# Patient Record
Sex: Female | Born: 1950 | Race: White | Hispanic: No | Marital: Married | Smoking: Never smoker
Health system: Southern US, Community
[De-identification: ages and names within clinical notes are randomized; demographics above are authoritative.]

---

## 2004-02-12 ENCOUNTER — Ambulatory Visit: Payer: Self-pay | Admitting: Internal Medicine

## 2005-02-23 ENCOUNTER — Ambulatory Visit: Payer: Self-pay | Admitting: Internal Medicine

## 2006-03-07 ENCOUNTER — Ambulatory Visit: Payer: Self-pay | Admitting: Internal Medicine

## 2007-03-13 ENCOUNTER — Ambulatory Visit: Payer: Self-pay | Admitting: Internal Medicine

## 2007-04-24 ENCOUNTER — Ambulatory Visit: Payer: Self-pay | Admitting: Unknown Physician Specialty

## 2008-03-17 ENCOUNTER — Ambulatory Visit: Payer: Self-pay | Admitting: Internal Medicine

## 2009-03-31 ENCOUNTER — Ambulatory Visit: Payer: Self-pay | Admitting: Internal Medicine

## 2010-04-06 ENCOUNTER — Ambulatory Visit: Payer: Self-pay | Admitting: Internal Medicine

## 2011-04-10 ENCOUNTER — Ambulatory Visit: Payer: Self-pay | Admitting: Family Medicine

## 2011-04-12 ENCOUNTER — Ambulatory Visit: Payer: Self-pay | Admitting: Family Medicine

## 2012-04-12 ENCOUNTER — Ambulatory Visit: Payer: Self-pay | Admitting: Family Medicine

## 2012-06-18 ENCOUNTER — Ambulatory Visit: Payer: Self-pay | Admitting: Unknown Physician Specialty

## 2013-04-14 ENCOUNTER — Ambulatory Visit: Payer: Self-pay | Admitting: Family Medicine

## 2014-02-25 ENCOUNTER — Ambulatory Visit: Payer: Self-pay | Admitting: Family Medicine

## 2014-04-15 ENCOUNTER — Ambulatory Visit: Payer: Self-pay | Admitting: Family Medicine

## 2015-04-26 ENCOUNTER — Other Ambulatory Visit: Payer: Self-pay | Admitting: Family Medicine

## 2015-04-26 DIAGNOSIS — Z1231 Encounter for screening mammogram for malignant neoplasm of breast: Secondary | ICD-10-CM

## 2015-05-06 ENCOUNTER — Other Ambulatory Visit: Payer: Self-pay | Admitting: Family Medicine

## 2015-05-06 ENCOUNTER — Ambulatory Visit
Admission: RE | Admit: 2015-05-06 | Discharge: 2015-05-06 | Disposition: A | Discharge status: Home / Self Care | Payer: Medicare Other | Source: Ambulatory Visit | Attending: Family Medicine | Admitting: Family Medicine

## 2015-05-06 DIAGNOSIS — Z1231 Encounter for screening mammogram for malignant neoplasm of breast: Secondary | ICD-10-CM

## 2016-04-18 ENCOUNTER — Other Ambulatory Visit: Payer: Self-pay | Admitting: Family Medicine

## 2016-04-18 DIAGNOSIS — Z1231 Encounter for screening mammogram for malignant neoplasm of breast: Secondary | ICD-10-CM

## 2016-05-16 ENCOUNTER — Ambulatory Visit
Admission: RE | Admit: 2016-05-16 | Discharge: 2016-05-16 | Disposition: A | Discharge status: Home / Self Care | Payer: Medicare Other | Source: Ambulatory Visit | Attending: Family Medicine | Admitting: Family Medicine

## 2016-05-16 DIAGNOSIS — Z1231 Encounter for screening mammogram for malignant neoplasm of breast: Secondary | ICD-10-CM | POA: Diagnosis present

## 2017-04-10 ENCOUNTER — Other Ambulatory Visit: Payer: Self-pay | Admitting: Family Medicine

## 2017-04-10 DIAGNOSIS — Z1231 Encounter for screening mammogram for malignant neoplasm of breast: Secondary | ICD-10-CM

## 2017-05-17 ENCOUNTER — Ambulatory Visit
Admission: RE | Admit: 2017-05-17 | Discharge: 2017-05-17 | Disposition: A | Discharge status: Home / Self Care | Payer: Medicare Other | Source: Ambulatory Visit | Attending: Family Medicine | Admitting: Family Medicine

## 2017-05-17 DIAGNOSIS — Z1231 Encounter for screening mammogram for malignant neoplasm of breast: Secondary | ICD-10-CM | POA: Diagnosis not present

## 2018-04-12 ENCOUNTER — Other Ambulatory Visit: Payer: Self-pay | Admitting: Family Medicine

## 2018-04-12 DIAGNOSIS — Z1231 Encounter for screening mammogram for malignant neoplasm of breast: Secondary | ICD-10-CM

## 2018-05-20 ENCOUNTER — Ambulatory Visit
Admission: RE | Admit: 2018-05-20 | Discharge: 2018-05-20 | Disposition: A | Discharge status: Home / Self Care | Payer: Medicare Other | Source: Ambulatory Visit | Attending: Family Medicine | Admitting: Family Medicine

## 2018-05-20 DIAGNOSIS — Z1231 Encounter for screening mammogram for malignant neoplasm of breast: Secondary | ICD-10-CM | POA: Insufficient documentation

## 2019-02-24 ENCOUNTER — Emergency Department: Payer: Medicare Other

## 2019-02-24 ENCOUNTER — Other Ambulatory Visit: Payer: Self-pay

## 2019-02-24 ENCOUNTER — Encounter: Payer: Self-pay | Admitting: Emergency Medicine

## 2019-02-24 ENCOUNTER — Emergency Department
Admission: EM | Admit: 2019-02-24 | Discharge: 2019-02-24 | Disposition: A | Discharge status: Home / Self Care | Payer: Medicare Other | Attending: Emergency Medicine | Admitting: Emergency Medicine

## 2019-02-24 DIAGNOSIS — Y939 Activity, unspecified: Secondary | ICD-10-CM | POA: Diagnosis not present

## 2019-02-24 DIAGNOSIS — S32010A Wedge compression fracture of first lumbar vertebra, initial encounter for closed fracture: Secondary | ICD-10-CM

## 2019-02-24 DIAGNOSIS — S62102A Fracture of unspecified carpal bone, left wrist, initial encounter for closed fracture: Secondary | ICD-10-CM | POA: Diagnosis not present

## 2019-02-24 DIAGNOSIS — Y999 Unspecified external cause status: Secondary | ICD-10-CM | POA: Diagnosis not present

## 2019-02-24 DIAGNOSIS — Y929 Unspecified place or not applicable: Secondary | ICD-10-CM | POA: Insufficient documentation

## 2019-02-24 DIAGNOSIS — W010XXA Fall on same level from slipping, tripping and stumbling without subsequent striking against object, initial encounter: Secondary | ICD-10-CM | POA: Insufficient documentation

## 2019-02-24 DIAGNOSIS — Z79899 Other long term (current) drug therapy: Secondary | ICD-10-CM | POA: Diagnosis not present

## 2019-02-24 DIAGNOSIS — S6992XA Unspecified injury of left wrist, hand and finger(s), initial encounter: Secondary | ICD-10-CM | POA: Diagnosis present

## 2019-02-24 MED ORDER — TRAMADOL HCL 50 MG PO TABS
50.0000 mg | ORAL_TABLET | Freq: Once | ORAL | Status: AC
  Administered 2019-02-24: 50 mg via ORAL
  Filled 2019-02-24: qty 1

## 2019-02-24 MED ORDER — TRAMADOL HCL 50 MG PO TABS: 50.0000 mg | ORAL_TABLET | Freq: Two times a day (BID) | ORAL | 0 refills | Status: AC | PRN

## 2019-02-24 NOTE — ED Triage Notes (Signed)
Says slipped and fell this am on wet surface.  Injured low back and left wrist.

## 2019-02-24 NOTE — ED Provider Notes (Signed)
[facility] Emergency Department Provider Note   ____________________________________________   First MD Initiated Contact with Patient 02/24/19 1017     (approximate)  I have reviewed the triage vital signs and the nursing notes.   HISTORY  Chief Complaint Back Pain, Fall, and Wrist Pain    HPI Kaitlyn Galvan is a 68 y.o. female patient presents with left wrist pain/laceration and low back pain secondary to a slip and fall prior to arrival.  Patient states she went to a driveway which was when she slipped and fell breaking fall with left outstretched hand.  Patient denies loss sensation or loss of function.  Patient rates the pain as a 10/10.  Patient described the pain is "achy".  No palliative measures prior to arrival.         History reviewed. No pertinent past medical history.  There are no active problems to display for this patient.   History reviewed. No pertinent surgical history.  Prior to Admission medications   Medication Sig Start Date End Date Taking? Authorizing Provider  fluticasone (FLONASE) 50 MCG/ACT nasal spray Place 2 sprays into both nostrils daily.   Yes [provider]  lovastatin (MEVACOR) 20 MG tablet Take 20 mg by mouth at bedtime.   Yes [provider]    Allergies Penicillins  Family History  Problem Relation Age of Onset  . Breast cancer Neg Hx     Social History Social History   Tobacco Use  . Smoking status: Never Smoker  . Smokeless tobacco: Never Used  Substance Use Topics  . Alcohol use: Yes  . Drug use: Not on file    Review of Systems Constitutional: No fever/chills Eyes: No visual changes. ENT: No sore throat. Cardiovascular: Denies chest pain. Respiratory: Denies shortness of breath. Gastrointestinal: No abdominal pain.  No nausea, no vomiting.  No diarrhea.  No constipation. Genitourinary: Negative for dysuria. Musculoskeletal: Left wrist and back pain. Skin: Negative  for rash. Neurological: Negative for headaches, focal weakness or numbness. Allergic/Immunilogical: Penicillin ____________________________________________   PHYSICAL EXAM:  VITAL SIGNS: ED Triage Vitals  Enc Vitals Group     BP 02/24/19 1015 (!) 158/94     Pulse Rate 02/24/19 1015 71     Resp 02/24/19 1015 20     Temp 02/24/19 1015 98.4 F (36.9 C)     Temp Source 02/24/19 1015 Oral     SpO2 02/24/19 1015 100 %     Weight 02/24/19 1007 140 lb (63.5 kg)     Height 02/24/19 1007 5\' 3"  (1.6 m)     Head Circumference --      Peak Flow --      Pain Score 02/24/19 1006 10     Pain Loc --      Pain Edu? --      Excl. in [place]? --    Constitutional: Alert and oriented. Well appearing and in no acute distress. Neck: No cervical spine tenderness to palpation. Hematological/Lymphatic/Immunilogical: No cervical lymphadenopathy. Cardiovascular: Normal rate, regular rhythm. Grossly normal heart sounds.  Good peripheral circulation. Respiratory: Normal respiratory effort.  No retractions. Lungs CTAB. Gastrointestinal: Soft and nontender. No distention. No abdominal bruits. No CVA tenderness. Genitourinary: Deferred **}Musculoskeletal: No lower extremity tenderness nor edema.  No joint effusions. Neurologic:  Normal speech and language. No gross focal neurologic deficits are appreciated. No gait instability. Skin:  Skin is warm, dry and intact. No rash noted. Psychiatric: Mood and affect are normal. Speech and behavior are normal.  ____________________________________________   LABS (all labs ordered are listed, but only abnormal results are displayed)  Labs Reviewed - No data to display ____________________________________________  EKG   ____________________________________________  RADIOLOGY  ED MD interpretation:    Official radiology report(s): Dg Lumbar Spine 2-3 Views  Result Date: 02/24/2019 CLINICAL DATA:  Larey Seat today with low back pain. EXAM: LUMBAR SPINE - 2-3 VIEW  COMPARISON:  None. FINDINGS: Five lumbar type vertebral bodies show normal alignment. There is a superior endplate compression fracture of L1 with loss of height of 1/3. No visible retropulsion. No other regional acute fracture. No significant pre-existing degenerative changes. 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 IMPRESSION: Acute superior endplate fracture at L1 with loss of height of 1/3. Electronically Signed   By: Paulina Fusi M.D.   On: 02/24/2019 11:27   Dg Wrist Complete Left  Result Date: 02/24/2019 CLINICAL DATA:  Larey Seat on outstretched arm EXAM: LEFT WRIST - COMPLETE 3+ VIEW COMPARISON:  None. FINDINGS: Comminuted fracture of the distal radius with volar angulation and dorsal tilt of the distal radial articular surface. Mild impaction. No ulnar fracture or carpal bone fracture seen. IMPRESSION: Comminuted fracture of the distal radius with ventral angulation and dorsal tilt of the distal radial articular surface. Slight impaction. Electronically Signed   By: Paulina Fusi M.D.   On: 02/24/2019 11:26    ____________________________________________   PROCEDURES  Procedure(s) performed (including Critical Care):  Procedures   ____________________________________________   INITIAL IMPRESSION / ASSESSMENT AND PLAN / ED COURSE  As part of my medical decision making, I reviewed the following data within the electronic MEDICAL RECORD NUMBER     Patient presents with left wrist pain and deformity and low back pain secondary to a slip and fall this morning.  Discussed x-ray findings with patient.  Dr. Hyacinth Meeker will see patient in his office this afternoon.     ____________________________________________   FINAL CLINICAL IMPRESSION(S) / ED DIAGNOSES  Final diagnoses:  None     ED Discharge Orders    None       Note:  This document was prepared using Dragon voice recognition software and may include unintentional dictation errors.    Joni Reining, PA-C  02/24/19 1336    Sharman Cheek, MD 02/28/19 [phone]

## 2019-02-24 NOTE — ED Notes (Signed)
See triage note  Presents s/p fall this am  Injury to lower back and left wrist  Deformity note to left wrist with small laceration noted to posterior wrist

## 2019-02-27 ENCOUNTER — Other Ambulatory Visit: Payer: Self-pay | Admitting: Specialist

## 2019-02-28 ENCOUNTER — Other Ambulatory Visit
Admission: RE | Admit: 2019-02-28 | Discharge: 2019-02-28 | Disposition: A | Discharge status: Home / Self Care | Payer: Medicare Other | Source: Ambulatory Visit | Attending: Specialist | Admitting: Specialist

## 2019-02-28 DIAGNOSIS — Z20828 Contact with and (suspected) exposure to other viral communicable diseases: Secondary | ICD-10-CM | POA: Insufficient documentation

## 2019-02-28 DIAGNOSIS — Z01812 Encounter for preprocedural laboratory examination: Secondary | ICD-10-CM | POA: Insufficient documentation

## 2019-02-28 LAB — SARS CORONAVIRUS 2 (TAT 6-24 HRS): SARS Coronavirus 2: NEGATIVE

## 2019-03-03 ENCOUNTER — Other Ambulatory Visit: Payer: Self-pay

## 2019-03-03 ENCOUNTER — Ambulatory Visit
Admission: RE | Admit: 2019-03-03 | Discharge: 2019-03-03 | Disposition: A | Discharge status: Home / Self Care | Payer: Medicare Other | Attending: Specialist | Admitting: Specialist

## 2019-03-03 ENCOUNTER — Encounter
Admission: RE | Disposition: A | Discharge status: Home / Self Care | Payer: Self-pay | Source: Home / Self Care | Attending: Specialist

## 2019-03-03 ENCOUNTER — Ambulatory Visit: Payer: Medicare Other | Admitting: Certified Registered Nurse Anesthetist

## 2019-03-03 DIAGNOSIS — S52532A Colles' fracture of left radius, initial encounter for closed fracture: Secondary | ICD-10-CM | POA: Diagnosis present

## 2019-03-03 DIAGNOSIS — X58XXXA Exposure to other specified factors, initial encounter: Secondary | ICD-10-CM | POA: Insufficient documentation

## 2019-03-03 HISTORY — PX: ORIF RADIAL FRACTURE: SHX5113

## 2019-03-03 SURGERY — OPEN REDUCTION INTERNAL FIXATION (ORIF) RADIAL FRACTURE
Anesthesia: General | Site: Wrist | Laterality: Left

## 2019-03-03 MED ORDER — FENTANYL CITRATE (PF) 100 MCG/2ML IJ SOLN
INTRAMUSCULAR | Status: AC
  Administered 2019-03-03: 12:00:00 25 ug via INTRAVENOUS
  Filled 2019-03-03: qty 2

## 2019-03-03 MED ORDER — DEXAMETHASONE SODIUM PHOSPHATE 10 MG/ML IJ SOLN
INTRAMUSCULAR | Status: DC | PRN
  Administered 2019-03-03: 10 mg via INTRAVENOUS

## 2019-03-03 MED ORDER — FENTANYL CITRATE (PF) 100 MCG/2ML IJ SOLN
INTRAMUSCULAR | Status: AC
  Filled 2019-03-03: qty 2

## 2019-03-03 MED ORDER — FENTANYL CITRATE (PF) 100 MCG/2ML IJ SOLN
INTRAMUSCULAR | Status: AC
  Administered 2019-03-03: 13:00:00 25 ug via INTRAVENOUS
  Filled 2019-03-03: qty 2

## 2019-03-03 MED ORDER — MELOXICAM 7.5 MG PO TABS
15.0000 mg | ORAL_TABLET | ORAL | Status: AC
  Administered 2019-03-03: 09:00:00 15 mg via ORAL

## 2019-03-03 MED ORDER — MELOXICAM 15 MG PO TABS: 15.0000 mg | ORAL_TABLET | Freq: Every day | ORAL | 3 refills | Status: AC

## 2019-03-03 MED ORDER — NEOMYCIN-POLYMYXIN B GU 40-200000 IR SOLN
Status: AC
  Filled 2019-03-03: qty 20

## 2019-03-03 MED ORDER — LIDOCAINE HCL (CARDIAC) PF 100 MG/5ML IV SOSY
PREFILLED_SYRINGE | INTRAVENOUS | Status: DC | PRN
  Administered 2019-03-03: 80 mg via INTRAVENOUS

## 2019-03-03 MED ORDER — MIDAZOLAM HCL 2 MG/2ML IJ SOLN
INTRAMUSCULAR | Status: AC
  Filled 2019-03-03: qty 2

## 2019-03-03 MED ORDER — CEFAZOLIN SODIUM-DEXTROSE 2-4 GM/100ML-% IV SOLN
INTRAVENOUS | Status: AC
  Filled 2019-03-03: qty 100

## 2019-03-03 MED ORDER — HYDROMORPHONE HCL 1 MG/ML IJ SOLN
INTRAMUSCULAR | Status: AC
  Administered 2019-03-03: 14:00:00 0.5 mg via INTRAVENOUS
  Filled 2019-03-03: qty 1

## 2019-03-03 MED ORDER — CLINDAMYCIN PHOSPHATE 600 MG/50ML IV SOLN
INTRAVENOUS | Status: AC
  Filled 2019-03-03: qty 50

## 2019-03-03 MED ORDER — GABAPENTIN 400 MG PO CAPS: 400.0000 mg | ORAL_CAPSULE | Freq: Three times a day (TID) | ORAL | 3 refills | Status: AC

## 2019-03-03 MED ORDER — SEVOFLURANE IN SOLN
RESPIRATORY_TRACT | Status: AC
  Filled 2019-03-03: qty 250

## 2019-03-03 MED ORDER — HYDROMORPHONE HCL 1 MG/ML IJ SOLN
0.5000 mg | INTRAMUSCULAR | Status: DC | PRN
  Administered 2019-03-03 (×2): 0.5 mg via INTRAVENOUS

## 2019-03-03 MED ORDER — CHLORHEXIDINE GLUCONATE CLOTH 2 % EX PADS
6.0000 | MEDICATED_PAD | Freq: Once | CUTANEOUS | Status: AC
  Administered 2019-03-03: 09:00:00 6 via TOPICAL

## 2019-03-03 MED ORDER — BUPIVACAINE HCL (PF) 0.5 % IJ SOLN
INTRAMUSCULAR | Status: AC
  Filled 2019-03-03: qty 30

## 2019-03-03 MED ORDER — NEOMYCIN-POLYMYXIN B GU 40-200000 IR SOLN
Status: DC | PRN
  Administered 2019-03-03: 2 mL

## 2019-03-03 MED ORDER — HYDROCODONE-ACETAMINOPHEN 5-325 MG PO TABS: 1.0000 | ORAL_TABLET | Freq: Four times a day (QID) | ORAL | 0 refills | Status: AC | PRN

## 2019-03-03 MED ORDER — BUPIVACAINE HCL (PF) 0.5 % IJ SOLN
INTRAMUSCULAR | Status: DC | PRN
  Administered 2019-03-03: 25 mL

## 2019-03-03 MED ORDER — PROPOFOL 10 MG/ML IV BOLUS
INTRAVENOUS | Status: DC | PRN
  Administered 2019-03-03: 150 mg via INTRAVENOUS

## 2019-03-03 MED ORDER — MELOXICAM 7.5 MG PO TABS
ORAL_TABLET | ORAL | Status: AC
  Filled 2019-03-03: qty 2

## 2019-03-03 MED ORDER — ONDANSETRON HCL 4 MG/2ML IJ SOLN
INTRAMUSCULAR | Status: DC | PRN
  Administered 2019-03-03: 4 mg via INTRAVENOUS

## 2019-03-03 MED ORDER — FENTANYL CITRATE (PF) 100 MCG/2ML IJ SOLN
25.0000 ug | INTRAMUSCULAR | Status: AC | PRN
  Administered 2019-03-03 (×4): 25 ug via INTRAVENOUS

## 2019-03-03 MED ORDER — GABAPENTIN 300 MG PO CAPS
ORAL_CAPSULE | ORAL | Status: AC
  Filled 2019-03-03: qty 1

## 2019-03-03 MED ORDER — CLINDAMYCIN PHOSPHATE 600 MG/50ML IV SOLN
600.0000 mg | INTRAVENOUS | Status: AC
  Administered 2019-03-03: 10:00:00 600 mg via INTRAVENOUS

## 2019-03-03 MED ORDER — HYDROCODONE-ACETAMINOPHEN 5-325 MG PO TABS
ORAL_TABLET | ORAL | Status: AC
  Filled 2019-03-03: qty 1

## 2019-03-03 MED ORDER — FENTANYL CITRATE (PF) 100 MCG/2ML IJ SOLN
25.0000 ug | INTRAMUSCULAR | Status: DC | PRN
  Administered 2019-03-03 (×4): 25 ug via INTRAVENOUS

## 2019-03-03 MED ORDER — GABAPENTIN 300 MG PO CAPS
300.0000 mg | ORAL_CAPSULE | ORAL | Status: AC
  Administered 2019-03-03: 09:00:00 300 mg via ORAL

## 2019-03-03 MED ORDER — FENTANYL CITRATE (PF) 100 MCG/2ML IJ SOLN
INTRAMUSCULAR | Status: DC | PRN
  Administered 2019-03-03 (×3): 25 ug via INTRAVENOUS
  Administered 2019-03-03: 50 ug via INTRAVENOUS
  Administered 2019-03-03 (×3): 25 ug via INTRAVENOUS

## 2019-03-03 MED ORDER — PROPOFOL 10 MG/ML IV BOLUS
INTRAVENOUS | Status: AC
  Filled 2019-03-03: qty 20

## 2019-03-03 MED ORDER — ACETAMINOPHEN 10 MG/ML IV SOLN
INTRAVENOUS | Status: AC
  Filled 2019-03-03: qty 100

## 2019-03-03 MED ORDER — ACETAMINOPHEN 10 MG/ML IV SOLN
INTRAVENOUS | Status: DC | PRN
  Administered 2019-03-03: 1000 mg via INTRAVENOUS

## 2019-03-03 MED ORDER — CEFAZOLIN SODIUM-DEXTROSE 2-4 GM/100ML-% IV SOLN
2.0000 g | INTRAVENOUS | Status: AC
  Administered 2019-03-03: 10:00:00 2 g via INTRAVENOUS

## 2019-03-03 MED ORDER — SODIUM CHLORIDE FLUSH 0.9 % IV SOLN
INTRAVENOUS | Status: AC
  Filled 2019-03-03: qty 10

## 2019-03-03 MED ORDER — ONDANSETRON HCL 4 MG/2ML IJ SOLN: 4.0000 mg | Freq: Once | INTRAMUSCULAR | Status: DC | PRN

## 2019-03-03 MED ORDER — HYDROCODONE-ACETAMINOPHEN 5-325 MG PO TABS
1.0000 | ORAL_TABLET | Freq: Once | ORAL | Status: AC
  Administered 2019-03-03: 15:00:00 1 via ORAL

## 2019-03-03 MED ORDER — LACTATED RINGERS IV SOLN
INTRAVENOUS | Status: DC
  Administered 2019-03-03 (×2): via INTRAVENOUS

## 2019-03-03 MED ORDER — MIDAZOLAM HCL 2 MG/2ML IJ SOLN
INTRAMUSCULAR | Status: DC | PRN
  Administered 2019-03-03: 2 mg via INTRAVENOUS

## 2019-03-03 SURGICAL SUPPLY — 48 items
BIT DRILL 2.5X4 QC (BIT) ×3 IMPLANT
BLADE SURG MINI STRL (BLADE) ×3 IMPLANT
BNDG COHESIVE 4X5 TAN STRL (GAUZE/BANDAGES/DRESSINGS) IMPLANT
BNDG ESMARK 4X12 TAN STRL LF (GAUZE/BANDAGES/DRESSINGS) ×3 IMPLANT
CANISTER SUCT 1200ML W/VALVE (MISCELLANEOUS) ×3 IMPLANT
CHLORAPREP W/TINT 26 (MISCELLANEOUS) ×3 IMPLANT
COVER WAND RF STERILE (DRAPES) ×3 IMPLANT
CUFF TOURN SGL QUICK 18X4 (TOURNIQUET CUFF) IMPLANT
DRAPE FLUOR MINI C-ARM 54X84 (DRAPES) ×3 IMPLANT
DRSG GAUZE FLUFF 36X18 (GAUZE/BANDAGES/DRESSINGS) ×6 IMPLANT
ELECT REM PT RETURN 9FT ADLT (ELECTROSURGICAL) ×3
ELECTRODE REM PT RTRN 9FT ADLT (ELECTROSURGICAL) ×1 IMPLANT
GAUZE SPONGE 4X4 12PLY STRL (GAUZE/BANDAGES/DRESSINGS) ×3 IMPLANT
GAUZE XEROFORM 1X8 LF (GAUZE/BANDAGES/DRESSINGS) ×3 IMPLANT
GLOVE INDICATOR 8.0 STRL GRN (GLOVE) ×3 IMPLANT
GLOVE SURG ORTHO 8.5 STRL (GLOVE) ×3 IMPLANT
GOWN STRL REUS W/ TWL LRG LVL3 (GOWN DISPOSABLE) ×1 IMPLANT
GOWN STRL REUS W/TWL LRG LVL3 (GOWN DISPOSABLE) ×2
GOWN STRL REUS W/TWL LRG LVL4 (GOWN DISPOSABLE) ×3 IMPLANT
KIT TURNOVER KIT A (KITS) ×3 IMPLANT
NDL SAFETY ECLIPSE 18X1.5 (NEEDLE) ×1 IMPLANT
NEEDLE HYPO 18GX1.5 SHARP (NEEDLE) ×2
NS IRRIG 500ML POUR BTL (IV SOLUTION) ×3 IMPLANT
PACK EXTREMITY ARMC (MISCELLANEOUS) ×3 IMPLANT
PAD CAST CTTN 4X4 STRL (SOFTGOODS) ×1 IMPLANT
PADDING CAST 4IN STRL (MISCELLANEOUS) ×4
PADDING CAST BLEND 4X4 STRL (MISCELLANEOUS) ×2 IMPLANT
PADDING CAST COTTON 4X4 STRL (SOFTGOODS) ×2
PEG SUBCHONDRAL SMOOTH 2.0X18 (Peg) ×9 IMPLANT
PEG SUBCHONDRAL SMOOTH 2.0X20 (Peg) ×3 IMPLANT
PIN GUIDE .062 (PIN) ×3 IMPLANT
PLATE STAN 24.4X59.5 LT (Plate) ×3 IMPLANT
SCREW BN 12X3.5XNS CORT TI (Screw) ×4 IMPLANT
SCREW CORT 3.5X12 (Screw) ×8 IMPLANT
SCREW PEG LOCK 2.5X18 (Peg) ×6 IMPLANT
SCREW PEG LOCK 2.5X20 (Peg) ×3 IMPLANT
SPLINT CAST 1 STEP 3X12 (MISCELLANEOUS) ×3 IMPLANT
SPLINT CAST 1 STEP 4X30 (MISCELLANEOUS) IMPLANT
SPONGE LAP 18X18 RF (DISPOSABLE) ×3 IMPLANT
STAPLER SKIN PROX 35W (STAPLE) ×3 IMPLANT
STOCKINETTE BIAS CUT 4 980044 (GAUZE/BANDAGES/DRESSINGS) ×3 IMPLANT
STOCKINETTE STRL 4IN 9604848 (GAUZE/BANDAGES/DRESSINGS) ×3 IMPLANT
SUT VIC AB 2-0 SH 27 (SUTURE) ×2
SUT VIC AB 2-0 SH 27XBRD (SUTURE) ×1 IMPLANT
SUT VIC AB 3-0 PS2 18 (SUTURE) ×3 IMPLANT
SUT VIC AB 3-0 SH 27 (SUTURE) ×2
SUT VIC AB 3-0 SH 27X BRD (SUTURE) ×1 IMPLANT
WIRE Z .045 C-WIRE SPADE TIP (WIRE) ×6 IMPLANT

## 2019-03-03 NOTE — H&P (Signed)
THE PATIENT WAS SEEN PRIOR TO SURGERY TODAY.  HISTORY, ALLERGIES, HOME MEDICATIONS AND OPERATIVE PROCEDURE WERE REVIEWED. RISKS AND BENEFITS OF SURGERY DISCUSSED WITH PATIENT AGAIN.  NO CHANGES FROM INITIAL HISTORY AND PHYSICAL NOTED.    

## 2019-03-03 NOTE — Op Note (Signed)
03/03/2019  12:11 PM  PATIENT:  Kaitlyn Galvan    PRE-OPERATIVE DIAGNOSIS:  S52.532A colles fracture of left radius, initial encounter for closed fracture  POST-OPERATIVE DIAGNOSIS:  Same  PROCEDURE:  OPEN REDUCTION INTERNAL FIXATION (ORIF) RADIAL FRACTURE  SURGEON:  Park Breed, MD  TOURNIQUET TIME:  74  MIN  ANESTHESIA:   General  PREOPERATIVE INDICATIONS:  Kaitlyn Galvan is a  68 y.o. female with a diagnosis of S52.532A colles fracture of left radius, initial encounter for closed fracture who failed conservative measures and elected for surgical management.    The risks benefits and alternatives were discussed with the patient preoperatively including but not limited to the risks of infection, bleeding, nerve injury, malunion, nonunion, wrist stiffness, persistent wrist pain, osteoarthritis and the need for further surgery. Medical risks include but are not limited to DVT and pulmonary embolism, myocardial infarction, stroke, pneumonia, respiratory failure and death. Patient  understood these risks and wished to proceed.   OPERATIVE IMPLANTS: [employer] hand innovations plate, 4 hole  OPERATIVE FINDINGS: Comminuted left distal redius  OPERATIVE PROCEDURE: Patient was seen in the preoperative area. I marked the operative hand with the word yes and my initials according the hospital's correct site of surgery protocol. Patient was then brought to the operating roomand was placed supine on the operative table and underwent general anesthesia with an LMA.   The operative arm was prepped and draped in a sterile fashion. A timeout performed to verify the patient's name, date of birth, medical record number, correct site of surgery correct procedure to be performed. The timeout was also used a timeout to verify patient received antibiotics and appropriate instruments, implants and radiographs studies were available in the room. Once all in attendance were in agreement case began.   Patient  then had the operative extremity exsanguinated with an Esmarch. The tourniquet was placed on the upper extremity and inflated 250 mm.  A manual reduction of the fracture was performed. The fracture reduction was confirmed on FluoroScan imaging.  A linear incision was then made over the FCR tendon. The subcutaneous tissue was carefully dissected using Metzenbaum scissor and Adson pickup. Retractors were used to protect the radial artery and median nerve. The pronator quadratus was identified and incised and elevated off the volar surface of the distal radius. A 4 hole [employer] volar plate was then positioned on the under surface of the distal radius. It was held into position with a K wire. The position of the plate was confirmed on AP and lateral images. Once the plate was in good position a cortical screw was placed bicortically in the sliding hole. Attention was then turned to the distal pegs. The proximal row of pegs was placed first. Each individual peg hole was drilled and then measured with a depth gauge. The proximal row had 3 threaded pegs placed. The distal row was then drilled and smooth pegs were placed. The position and length of all screws were confirmed on AP and lateral FluoroScan imaging. Care was taken to avoid penetration of any peg through the articular surface of the distal radius.  Once all distal pegs were placed, the attention was turned back to placement of bicortical shaft screws. Additional screws were placed in the plate to fill the remaining holes. The wound was then copiously irrigated. Final FluoroScan imaging of the construct were taken. The fracture was in anatomic position and the hardware was well-positioned. The wound again was copiously irrigated. The soft tissue was then  carefully over the plate. The tissues were infiltrated with 1/2% marcaine.  The skin was closed with staples. Xeroform and a dry sterile dressing were applied along with a volar splint. I was scrubbed  and present for the entire case and all sharp and instrument counts were correct at the conclusion the case. The patient tolerated this procedure well and was awakened and taken to the recovery room in good condition.   Deeann Saint, MD

## 2019-03-03 NOTE — Anesthesia Procedure Notes (Signed)
Procedure Name: LMA Insertion Performed by: Cristle Jared, CRNA Pre-anesthesia Checklist: Patient identified, Patient being monitored, Timeout performed, Emergency Drugs available and Suction available Patient Re-evaluated:Patient Re-evaluated prior to induction Oxygen Delivery Method: Circle system utilized Preoxygenation: Pre-oxygenation with 100% oxygen Induction Type: IV induction Ventilation: Mask ventilation without difficulty LMA: LMA inserted LMA Size: 3.0 Tube type: Oral Number of attempts: 1 Placement Confirmation: positive ETCO2 and breath sounds checked- equal and bilateral Tube secured with: Tape Dental Injury: Teeth and Oropharynx as per pre-operative assessment        

## 2019-03-03 NOTE — Anesthesia Post-op Follow-up Note (Signed)
Anesthesia QCDR form completed.        

## 2019-03-03 NOTE — Anesthesia Preprocedure Evaluation (Signed)
Anesthesia Evaluation  Patient identified by MRN, date of birth, ID band Patient awake    Reviewed: Allergy & Precautions, H&P , NPO status , Patient's Chart, lab work & pertinent test results, reviewed documented beta blocker date and time   Airway Mallampati: II  TM Distance: >3 FB Neck ROM: full    Dental  (+) Teeth Intact   Pulmonary neg pulmonary ROS,    Pulmonary exam normal        Cardiovascular Exercise Tolerance: Good negative cardio ROS Normal cardiovascular exam Rate:Normal     Neuro/Psych negative neurological ROS  negative psych ROS   GI/Hepatic negative GI ROS, Neg liver ROS,   Endo/Other  negative endocrine ROS  Renal/GU negative Renal ROS  negative genitourinary   Musculoskeletal   Abdominal   Peds  Hematology negative hematology ROS (+)   Anesthesia Other Findings   Reproductive/Obstetrics negative OB ROS                             Anesthesia Physical Anesthesia Plan  ASA: II  Anesthesia Plan: General LMA   Post-op Pain Management:    Induction:   PONV Risk Score and Plan:   Airway Management Planned:   Additional Equipment:   Intra-op Plan:   Post-operative Plan:   Informed Consent: I have reviewed the patients History and Physical, chart, labs and discussed the procedure including the risks, benefits and alternatives for the proposed anesthesia with the patient or authorized representative who has indicated his/her understanding and acceptance.       Plan Discussed with: CRNA  Anesthesia Plan Comments:         Anesthesia Quick Evaluation  

## 2019-03-03 NOTE — Discharge Instructions (Signed)

## 2019-03-03 NOTE — Transfer of Care (Signed)
Immediate Anesthesia Transfer of Care Note  Patient: Kaitlyn Galvan  Procedure(s) Performed: OPEN REDUCTION INTERNAL FIXATION (ORIF) RADIAL FRACTURE (Left Wrist)  Patient Location: PACU  Anesthesia Type:General  Level of Consciousness: awake and alert   Airway & Oxygen Therapy: Patient Spontanous Breathing and Patient connected to face mask oxygen  Post-op Assessment: Report given to RN and Post -op Vital signs reviewed and stable  Post vital signs: Reviewed and stable  Last Vitals:  Vitals Value Taken Time  BP    Temp    Pulse    Resp    SpO2      Last Pain:  Vitals:   03/03/19 0910  TempSrc: Tympanic  PainSc: 10-Worst pain ever         Complications: No apparent anesthesia complications

## 2019-03-04 ENCOUNTER — Encounter: Payer: Self-pay | Admitting: Specialist

## 2019-03-05 NOTE — Anesthesia Postprocedure Evaluation (Signed)
Anesthesia Post Note  Patient: Kaitlyn Galvan  Procedure(s) Performed: OPEN REDUCTION INTERNAL FIXATION (ORIF) RADIAL FRACTURE (Left Wrist)  Patient location during evaluation: PACU Anesthesia Type: General Level of consciousness: awake and alert Pain management: pain level controlled Vital Signs Assessment: post-procedure vital signs reviewed and stable Respiratory status: spontaneous breathing, nonlabored ventilation, respiratory function stable and patient connected to nasal cannula oxygen Cardiovascular status: blood pressure returned to baseline and stable Postop Assessment: no apparent nausea or vomiting Anesthetic complications: no     Last Vitals:  Vitals:   03/03/19 1425 03/03/19 1520  BP: 129/73 108/84  Pulse: 71 84  Resp: 16 16  Temp:    SpO2: 99% 99%    Last Pain:  Vitals:   03/03/19 1520  TempSrc:   PainSc: 5                  Molli Barrows

## 2019-05-06 ENCOUNTER — Other Ambulatory Visit: Payer: Self-pay | Admitting: Family Medicine

## 2019-05-06 DIAGNOSIS — Z1231 Encounter for screening mammogram for malignant neoplasm of breast: Secondary | ICD-10-CM

## 2019-07-21 ENCOUNTER — Ambulatory Visit
Admission: RE | Admit: 2019-07-21 | Discharge: 2019-07-21 | Disposition: A | Discharge status: Home / Self Care | Payer: Medicare PPO | Source: Ambulatory Visit | Attending: Family Medicine | Admitting: Family Medicine

## 2019-07-21 DIAGNOSIS — Z1231 Encounter for screening mammogram for malignant neoplasm of breast: Secondary | ICD-10-CM | POA: Diagnosis not present

## 2019-07-23 ENCOUNTER — Other Ambulatory Visit: Payer: Self-pay | Admitting: Family Medicine

## 2019-07-23 DIAGNOSIS — N6489 Other specified disorders of breast: Secondary | ICD-10-CM

## 2019-07-23 DIAGNOSIS — R928 Other abnormal and inconclusive findings on diagnostic imaging of breast: Secondary | ICD-10-CM

## 2019-08-04 ENCOUNTER — Ambulatory Visit
Admission: RE | Admit: 2019-08-04 | Discharge: 2019-08-04 | Disposition: A | Discharge status: Home / Self Care | Payer: Medicare PPO | Source: Ambulatory Visit | Attending: Family Medicine | Admitting: Family Medicine

## 2019-08-04 DIAGNOSIS — R928 Other abnormal and inconclusive findings on diagnostic imaging of breast: Secondary | ICD-10-CM | POA: Diagnosis present

## 2019-08-04 DIAGNOSIS — N6489 Other specified disorders of breast: Secondary | ICD-10-CM | POA: Insufficient documentation

## 2020-03-22 ENCOUNTER — Other Ambulatory Visit: Payer: Medicare PPO

## 2020-03-22 DIAGNOSIS — Z20822 Contact with and (suspected) exposure to covid-19: Secondary | ICD-10-CM

## 2020-03-23 LAB — SARS-COV-2, NAA 2 DAY TAT

## 2020-03-23 LAB — NOVEL CORONAVIRUS, NAA: SARS-CoV-2, NAA: NOT DETECTED

## 2020-07-01 ENCOUNTER — Other Ambulatory Visit: Payer: Self-pay | Admitting: Family Medicine

## 2020-07-01 DIAGNOSIS — Z1231 Encounter for screening mammogram for malignant neoplasm of breast: Secondary | ICD-10-CM

## 2020-07-21 ENCOUNTER — Ambulatory Visit
Admission: RE | Admit: 2020-07-21 | Discharge: 2020-07-21 | Disposition: A | Discharge status: Home / Self Care | Payer: Medicare PPO | Source: Ambulatory Visit | Attending: Family Medicine | Admitting: Family Medicine

## 2020-07-21 ENCOUNTER — Other Ambulatory Visit: Payer: Self-pay

## 2020-07-21 DIAGNOSIS — Z1231 Encounter for screening mammogram for malignant neoplasm of breast: Secondary | ICD-10-CM | POA: Diagnosis not present

## 2021-02-13 IMAGING — MG MM DIGITAL DIAGNOSTIC UNILAT*R* W/ TOMO W/ CAD
4 series · 4 of 12 positions shown · non-contrast
Comparison: Previous exam(s).

CLINICAL DATA: Recall from screening mammography with
tomosynthesis, possible asymmetry in the UPPER RIGHT breast at
POSTERIOR depth visible only on the MLO images.

EXAM:
DIGITAL DIAGNOSTIC UNILATERAL RIGHT MAMMOGRAM WITH CAD AND TOMO

[R MLO synth-2D]
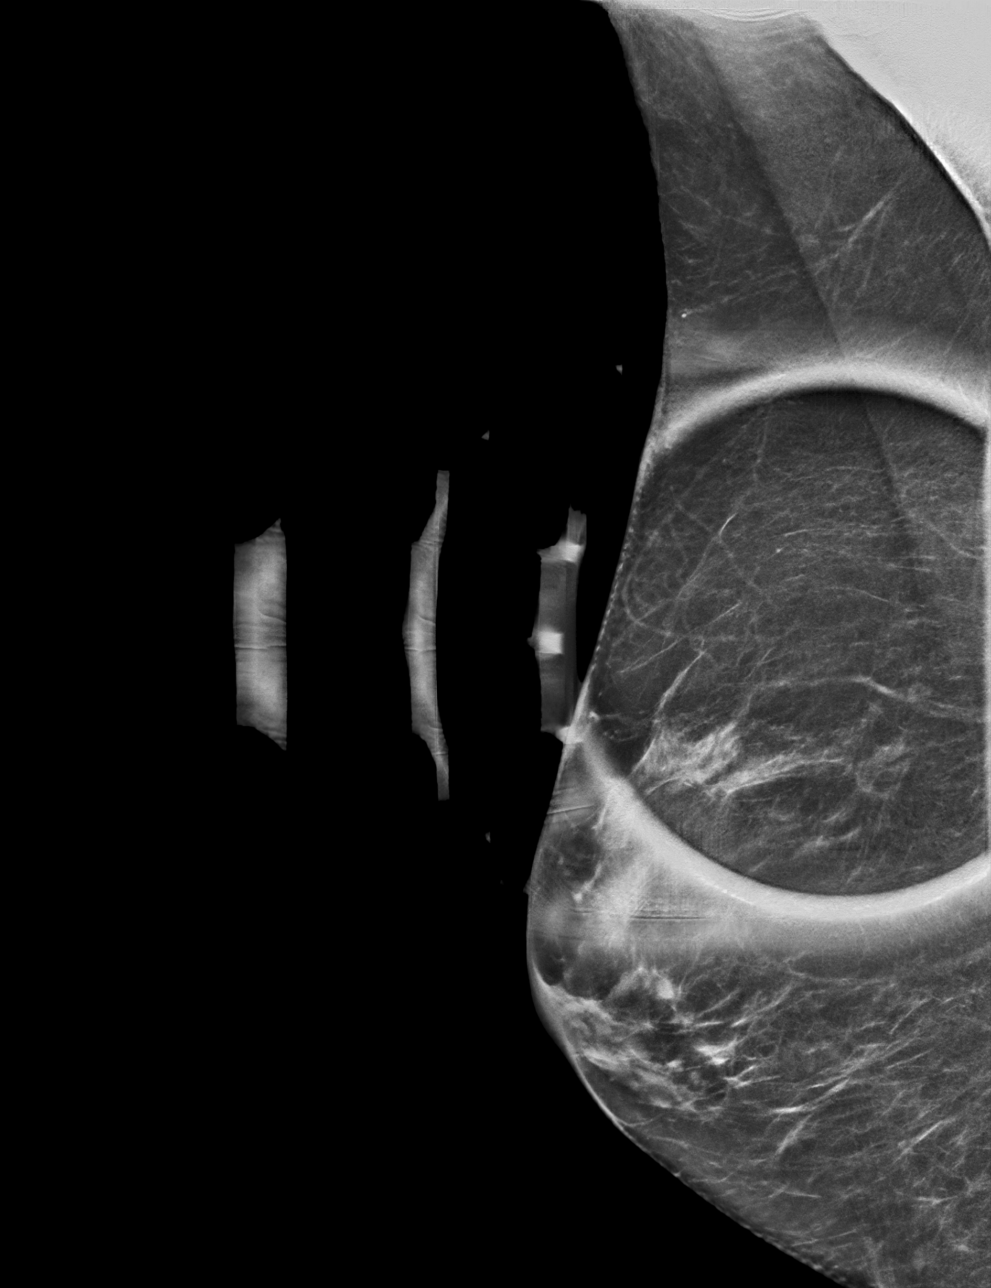

[R ML synth-2D]
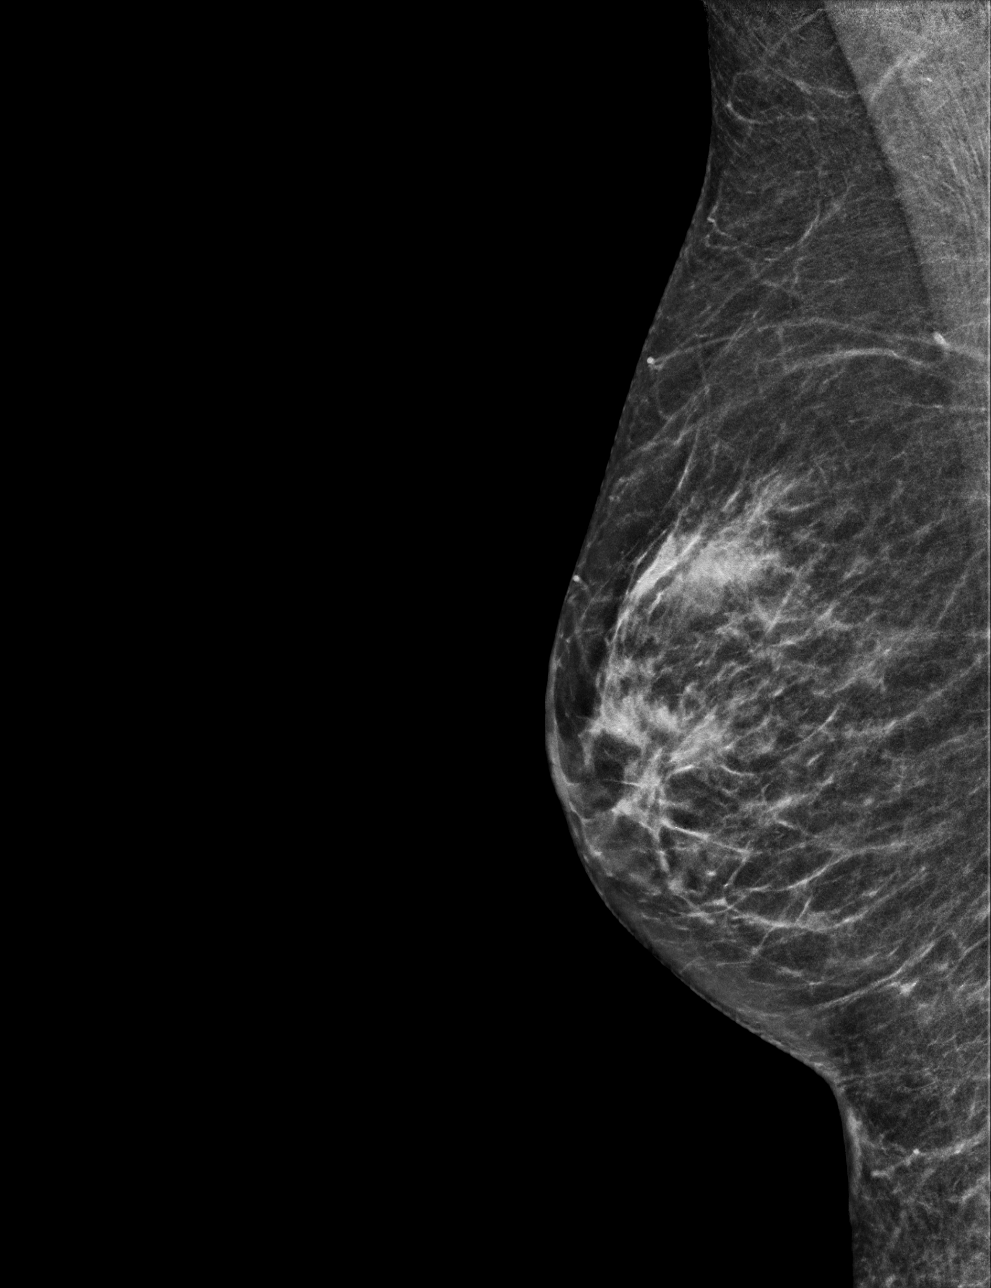

[R ML tomo · tomo slice 27/52.0]
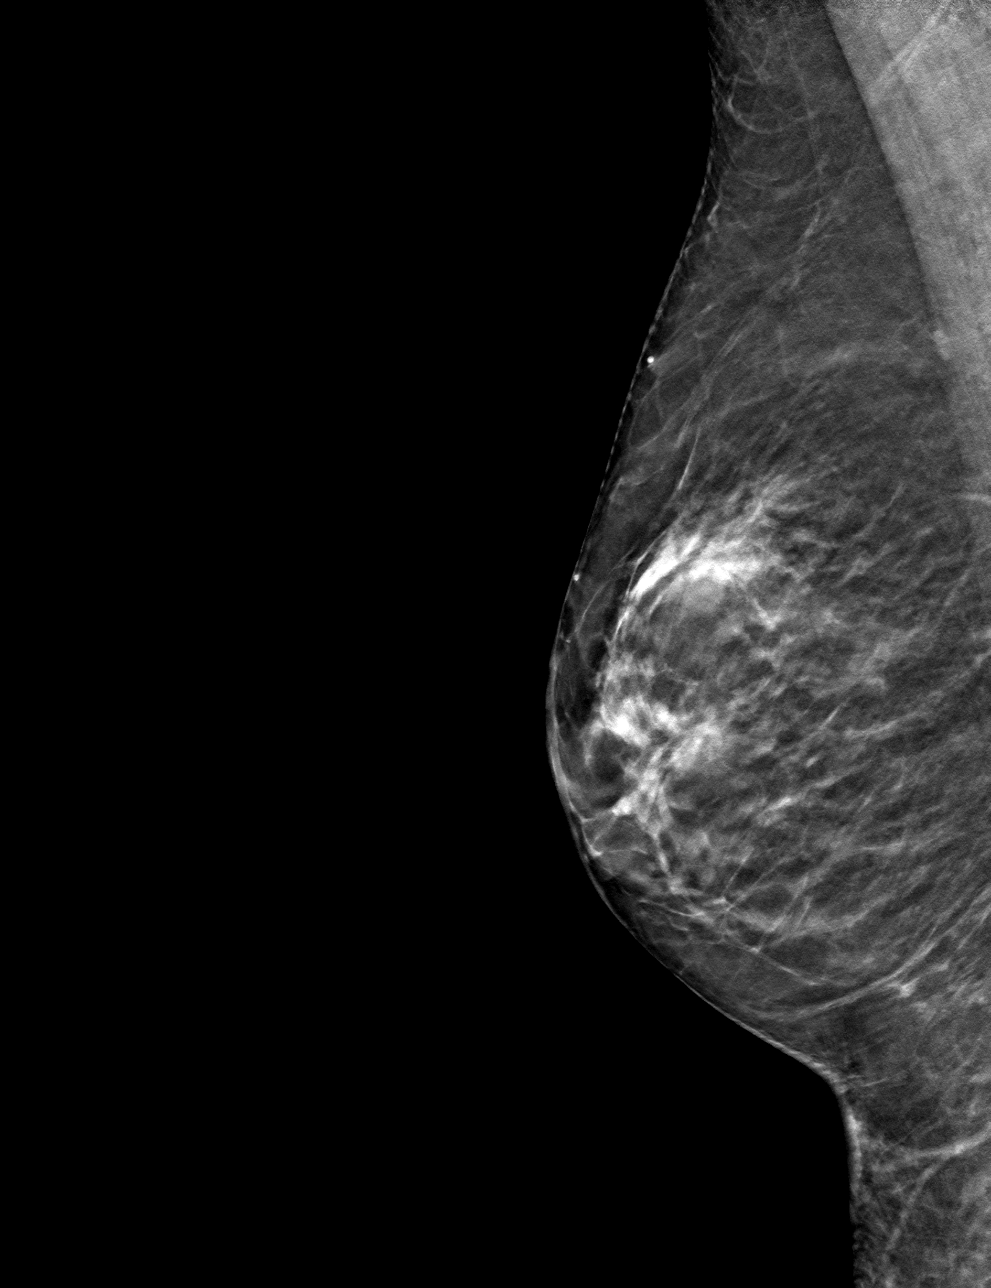

[R MLO tomo · tomo slice 31/61.0]
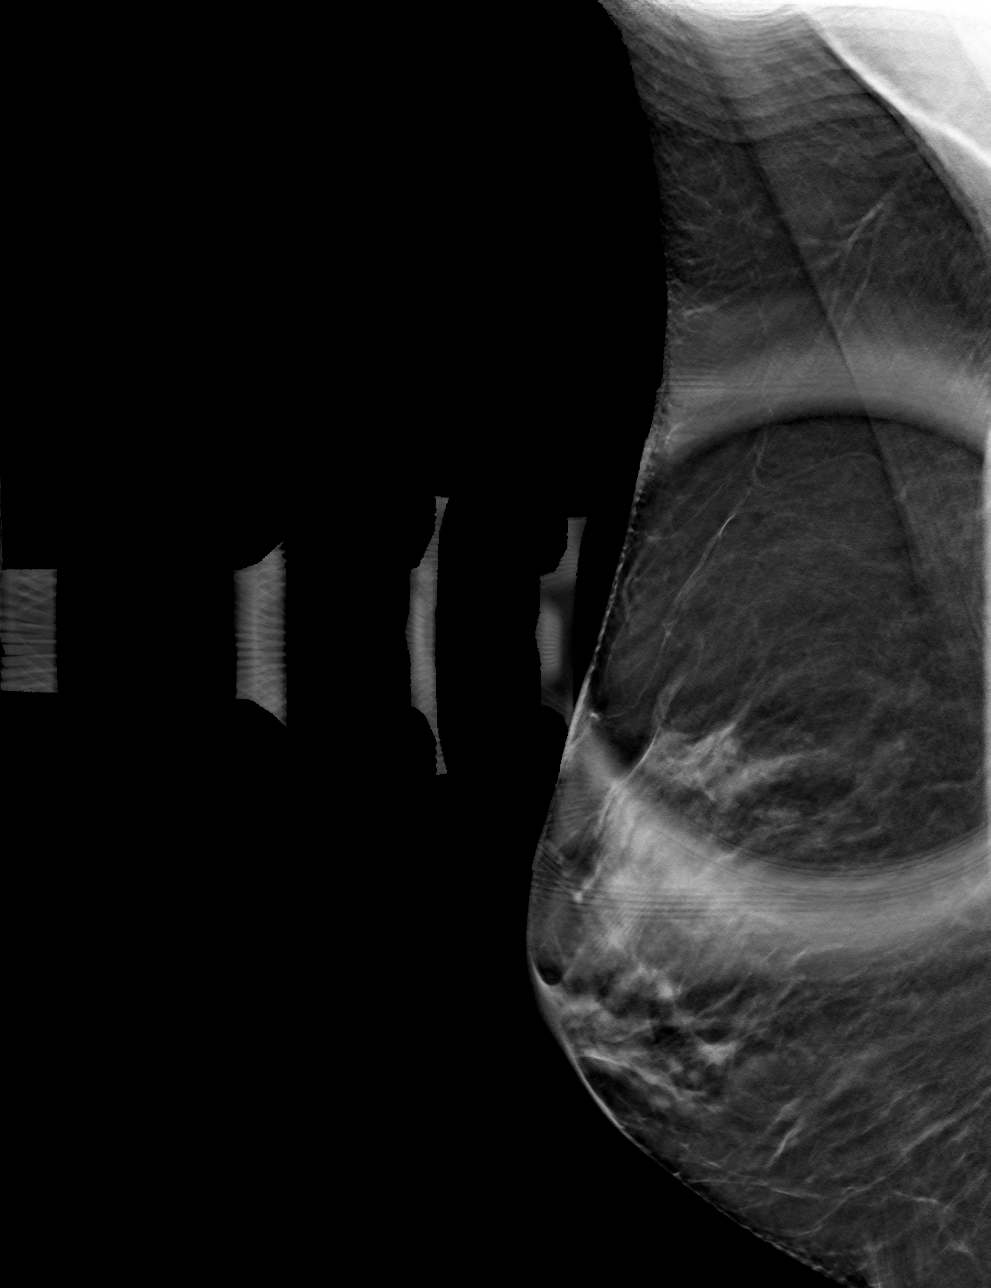

[4 of 12 positions shown; findings below may reference images not displayed]

ACR Breast Density Category b: There are scattered areas of
fibroglandular density.
FINDINGS: Tomosynthesis and synthesized spot-compression MLO view of the area
of concern in the RIGHT breast and a tomosynthesis and synthesized
full field mediolateral view of the RIGHT breast were obtained.

The asymmetry in the UPPER breast at POSTERIOR depth questioned on
screening mammography disperses with compression and there is no
underlying mass or architectural distortion.

No findings suspicious for malignancy in the RIGHT breast.

The full field mediolateral image was processed with CAD.
IMPRESSION: No mammographic evidence of malignancy involving the RIGHT breast.

RECOMMENDATION:
Screening mammogram in one year.(Code:[id number])

I have discussed the findings and recommendations with the patient.
If applicable, a reminder letter will be sent to the patient
regarding the next appointment.

BI-RADS CATEGORY  1: Negative.

## 2021-07-08 ENCOUNTER — Other Ambulatory Visit: Payer: Self-pay | Admitting: Family Medicine

## 2021-07-08 DIAGNOSIS — Z1231 Encounter for screening mammogram for malignant neoplasm of breast: Secondary | ICD-10-CM

## 2021-07-21 ENCOUNTER — Other Ambulatory Visit: Payer: Self-pay | Admitting: Family Medicine

## 2021-07-21 DIAGNOSIS — N644 Mastodynia: Secondary | ICD-10-CM

## 2021-08-09 ENCOUNTER — Ambulatory Visit
Admission: RE | Admit: 2021-08-09 | Discharge: 2021-08-09 | Disposition: A | Discharge status: Home / Self Care | Payer: Medicare PPO | Source: Ambulatory Visit | Attending: Family Medicine | Admitting: Family Medicine

## 2021-08-09 DIAGNOSIS — N644 Mastodynia: Secondary | ICD-10-CM

## 2022-08-01 ENCOUNTER — Other Ambulatory Visit: Payer: Self-pay | Admitting: Family Medicine

## 2022-08-01 DIAGNOSIS — Z9189 Other specified personal risk factors, not elsewhere classified: Secondary | ICD-10-CM

## 2022-08-01 DIAGNOSIS — Z0000 Encounter for general adult medical examination without abnormal findings: Secondary | ICD-10-CM

## 2022-08-01 DIAGNOSIS — E7800 Pure hypercholesterolemia, unspecified: Secondary | ICD-10-CM

## 2022-08-04 ENCOUNTER — Other Ambulatory Visit: Payer: Self-pay | Admitting: Family Medicine

## 2022-08-04 DIAGNOSIS — Z1231 Encounter for screening mammogram for malignant neoplasm of breast: Secondary | ICD-10-CM

## 2022-08-10 ENCOUNTER — Ambulatory Visit
Admission: RE | Admit: 2022-08-10 | Discharge: 2022-08-10 | Disposition: A | Discharge status: Home / Self Care | Payer: Self-pay | Source: Ambulatory Visit | Attending: Family Medicine | Admitting: Family Medicine

## 2022-08-10 DIAGNOSIS — Z0000 Encounter for general adult medical examination without abnormal findings: Secondary | ICD-10-CM | POA: Insufficient documentation

## 2022-08-10 DIAGNOSIS — Z9189 Other specified personal risk factors, not elsewhere classified: Secondary | ICD-10-CM | POA: Insufficient documentation

## 2022-08-10 DIAGNOSIS — E7800 Pure hypercholesterolemia, unspecified: Secondary | ICD-10-CM | POA: Insufficient documentation

## 2022-08-24 ENCOUNTER — Ambulatory Visit
Admission: RE | Admit: 2022-08-24 | Discharge: 2022-08-24 | Disposition: A | Discharge status: Home / Self Care | Payer: Medicare PPO | Source: Ambulatory Visit | Attending: Family Medicine | Admitting: Family Medicine

## 2022-08-24 DIAGNOSIS — Z1231 Encounter for screening mammogram for malignant neoplasm of breast: Secondary | ICD-10-CM | POA: Insufficient documentation

## 2023-01-04 ENCOUNTER — Ambulatory Visit: Payer: Medicare PPO

## 2023-01-04 DIAGNOSIS — Z800 Family history of malignant neoplasm of digestive organs: Secondary | ICD-10-CM | POA: Diagnosis not present

## 2023-01-04 DIAGNOSIS — K640 First degree hemorrhoids: Secondary | ICD-10-CM | POA: Diagnosis not present

## 2023-01-04 DIAGNOSIS — Z1211 Encounter for screening for malignant neoplasm of colon: Secondary | ICD-10-CM | POA: Diagnosis not present

## 2023-07-27 ENCOUNTER — Other Ambulatory Visit: Payer: Self-pay | Admitting: Family Medicine

## 2023-07-27 DIAGNOSIS — Z1231 Encounter for screening mammogram for malignant neoplasm of breast: Secondary | ICD-10-CM

## 2023-08-28 ENCOUNTER — Encounter

## 2023-08-30 ENCOUNTER — Ambulatory Visit
Admission: RE | Admit: 2023-08-30 | Discharge: 2023-08-30 | Disposition: A | Discharge status: Home / Self Care | Source: Ambulatory Visit | Attending: Family Medicine | Admitting: Family Medicine

## 2023-08-30 DIAGNOSIS — Z1231 Encounter for screening mammogram for malignant neoplasm of breast: Secondary | ICD-10-CM | POA: Diagnosis present
